# Patient Record
Sex: Male | Born: 1955
Health system: Southern US, Community
[De-identification: ages and names within clinical notes are randomized; demographics above are authoritative.]

## PROBLEM LIST (undated history)

## (undated) DIAGNOSIS — N486 Induration penis plastica: Secondary | ICD-10-CM

---

## 2008-10-31 ENCOUNTER — Encounter: Admission: RE | Admit: 2008-10-31 | Discharge: 2008-10-31 | Payer: Self-pay | Admitting: Family Medicine

## 2010-09-19 ENCOUNTER — Encounter: Payer: Self-pay | Admitting: Family Medicine

## 2018-01-12 ENCOUNTER — Emergency Department (HOSPITAL_COMMUNITY)
Admission: EM | Admit: 2018-01-12 | Discharge: 2018-01-12 | Discharge status: Against Medical Advice | Payer: 59 | Attending: Emergency Medicine | Admitting: Emergency Medicine

## 2018-01-12 ENCOUNTER — Other Ambulatory Visit: Payer: Self-pay

## 2018-01-12 ENCOUNTER — Emergency Department (HOSPITAL_COMMUNITY): Payer: 59

## 2018-01-12 ENCOUNTER — Encounter (HOSPITAL_COMMUNITY): Payer: Self-pay | Admitting: Emergency Medicine

## 2018-01-12 DIAGNOSIS — Y939 Activity, unspecified: Secondary | ICD-10-CM | POA: Insufficient documentation

## 2018-01-12 DIAGNOSIS — S39840A Fracture of corpus cavernosum penis, initial encounter: Secondary | ICD-10-CM

## 2018-01-12 DIAGNOSIS — R300 Dysuria: Secondary | ICD-10-CM | POA: Diagnosis not present

## 2018-01-12 DIAGNOSIS — Y929 Unspecified place or not applicable: Secondary | ICD-10-CM | POA: Insufficient documentation

## 2018-01-12 DIAGNOSIS — Y999 Unspecified external cause status: Secondary | ICD-10-CM | POA: Diagnosis not present

## 2018-01-12 DIAGNOSIS — X58XXXA Exposure to other specified factors, initial encounter: Secondary | ICD-10-CM | POA: Insufficient documentation

## 2018-01-12 DIAGNOSIS — N4889 Other specified disorders of penis: Secondary | ICD-10-CM | POA: Diagnosis present

## 2018-01-12 DIAGNOSIS — Z79899 Other long term (current) drug therapy: Secondary | ICD-10-CM | POA: Insufficient documentation

## 2018-01-12 HISTORY — DX: Induration penis plastica: N48.6

## 2018-01-12 LAB — URINALYSIS, ROUTINE W REFLEX MICROSCOPIC
Bilirubin Urine: NEGATIVE
GLUCOSE, UA: NEGATIVE mg/dL
HGB URINE DIPSTICK: NEGATIVE
Ketones, ur: NEGATIVE mg/dL
LEUKOCYTES UA: NEGATIVE
Nitrite: NEGATIVE
PH: 8 (ref 5.0–8.0)
PROTEIN: NEGATIVE mg/dL
Specific Gravity, Urine: 1.01 (ref 1.005–1.030)

## 2018-01-12 SURGERY — REPAIR, FRACTURE, PENIS
Anesthesia: Choice

## 2018-01-12 MED ORDER — GADOBENATE DIMEGLUMINE 529 MG/ML IV SOLN
15.0000 mL | Freq: Once | INTRAVENOUS | Status: AC | PRN
  Administered 2018-01-12: 15 mL via INTRAVENOUS

## 2018-01-12 NOTE — ED Provider Notes (Addendum)
[facility] Provider Note   CSN: [id number] Arrival date & time: 01/12/18  0239     History   Chief Complaint Chief Complaint  Patient presents with  . Medication Reaction    HPI Troy Bradford is a 62 y.o. male who presents with penis pain. PMH significant for Peyronie disease. He states that he sees Dr. Ronne Binning at [facility] who has been giving him Xiaflex injections. He had his first injection cycle of injections in March without any complications. He had his second round of injections on 5/14 and 5/16 (yesterday). Early in the morning (1AM) he was lying prone and then got up to go to the bathroom. He states he does not think he had an erection. When he urinated he had severe pain and he noticed that his penis was swollen and bruised. He reports being able to urinate but is hesitant to due to the pain. He subsequently came to the ED and states it has become worse since he's been here. He denies abdominal pain, N/V, testicular pain.  HPI  Past Medical History:  Diagnosis Date  . Peyronie disease     There are no active problems to display for this patient.   History reviewed. No pertinent surgical history.      Home Medications    Prior to Admission medications   Medication Sig Start Date End Date Taking? Authorizing Provider  Coenzyme Q10 (CO Q10) 200 MG CAPS Take 200 mg by mouth daily.   Yes [provider]  Collagenase Clostrid Histolyt (XIAFLEX) 0.9 MG SOLR Inject 0.9 mg as directed every 3 (three) days. Ever 3 days then off for 6 weeks   Yes [provider]  Magnesium 500 MG CAPS Take 500 mg by mouth daily.   Yes [provider]  Multiple Vitamin (MULTIVITAMIN WITH MINERALS) TABS tablet Take 1 tablet by mouth daily.   Yes [provider]  Omega-3 Fatty Acids (FISH OIL) 1200 MG CAPS Take 1,200 mg by mouth daily.   Yes [provider]  POTASSIUM PO Take 250 mg by mouth daily.   Yes  [provider]  rosuvastatin (CRESTOR) 10 MG tablet Take 10 mg by mouth daily.   Yes [provider]  tamsulosin (FLOMAX) 0.4 MG CAPS capsule Take 0.4 mg by mouth daily.   Yes [provider]    Family History No family history on file.  Social History Social History   Tobacco Use  . Smoking status: Never Smoker  . Smokeless tobacco: Never Used  Substance Use Topics  . Alcohol use: Never    Frequency: Never  . Drug use: Never     Allergies   Patient has no known allergies.   Review of Systems Review of Systems  Gastrointestinal: Negative for abdominal pain.  Genitourinary: Positive for dysuria, penile pain and penile swelling. Negative for decreased urine volume, difficulty urinating, discharge, flank pain and scrotal swelling.  All other systems reviewed and are negative.    Physical Exam Updated Vital Signs BP 133/86 (BP Location: Right Arm)   Pulse 75   Temp 98.7 F (37.1 C) (Oral)   Resp 16   SpO2 99%   Physical Exam  Constitutional: He is oriented to person, place, and time. He appears well-developed and well-nourished. No distress.  HENT:  Head: Normocephalic and atraumatic.  Eyes: Pupils are equal, round, and reactive to light. Conjunctivae are normal. Right eye exhibits no discharge. Left eye exhibits no discharge. No scleral icterus.  Neck: Normal range of motion.  Cardiovascular: Normal rate.  Pulmonary/Chest: Effort normal. No respiratory distress.  Abdominal: He exhibits no distension.  Genitourinary:  Genitourinary Comments: No inguinal lymphadenopathy or inguinal hernia noted. Circumcised penis. There is significant ecchymosis and swelling of the penis shaft which extends to the base. Testicles are nontender with normal lie. Normal scrotal appearance. No obvious discharge noted. Chaperone present during exam.    Neurological: He is alert and oriented to person, place, and time.  Skin: Skin is warm and dry.    Psychiatric: He has a normal mood and affect. His behavior is normal.  Nursing note and vitals reviewed.    ED Treatments / Results  Labs (all labs ordered are listed, but only abnormal results are displayed) Labs Reviewed  URINALYSIS, ROUTINE W REFLEX MICROSCOPIC    EKG None  Radiology No results found.  Procedures Procedures (including critical care time)  Medications Ordered in ED Medications - No data to display   Initial Impression / Assessment and Plan / ED Course  I have reviewed the triage vital signs and the nursing notes.  Pertinent labs & imaging results that were available during my care of the patient were reviewed by me and considered in my medical decision making (see chart for details).  59:17 AM 62 year old male presents with acute onset of dysuria, penis swelling and ecchymosis. Will consult Urology.  6:30 AM Spoke with Dr. Vernie Ammons who will come to see the patient. Concern for penile fracture.  7:00 AM MRI pelvis ordered to r/u penile fracture. UA is normal.  1:44 PM Pt has an acute penile fracture. Urology re-paged.  3:37 PM Urology to admit  3:54 PM Pt now does not want to be admitted for surgery. Urology discussed risks of leaving. He will leave AMA.     Final Clinical Impressions(s) / ED Diagnoses   Final diagnoses:  Fracture of corpus cavernosum penis, initial encounter    ED Discharge Orders    None       Bethel Born, PA-C 01/12/18 1537    Bethel Born, PA-C 01/12/18 1555    Zadie Rhine, MD 01/12/18 [phone]

## 2018-01-12 NOTE — ED Notes (Signed)
Delay explained to the patient. MRI stated that the patient will most likely go to MRI at 1130. Patient verbalized understanding and this RN thanked patient for their patience.

## 2018-01-12 NOTE — ED Notes (Signed)
MRI states that they will get patient in 30-45 minutes.

## 2018-01-12 NOTE — ED Notes (Signed)
Patient transported to MRI 

## 2018-01-12 NOTE — ED Triage Notes (Addendum)
Pt from home with c/o penile pain when urinating as a medication reaction to xiaflex for his peyronie's disease. Pt states when urinating pain is 10/10. During assessment pain is 0/10. Pt is able to urinate despite pain

## 2018-01-12 NOTE — Consult Note (Signed)
Urology Consult  CC: Referring physician: Bruce Donath, MD  Reason for referral: Acute penile pain and swelling  Impression/Assessment: Penile pain and swelling after Xiaflex injection: He had an acute onset of penile pain with some swelling and some ecchymoses.  Clinically I have a low suspicion for penile fracture although that is a known risk and I believe should be ruled out with further imaging studies. His UA was clear.  I am going to obtain an MRI scan.  2:30 p.m. the patient had been in the ER for several hours awaiting his MRI scan and once it was obtained it revealed the 2 areas of viable at injection.  Initially it was read the radiologist as corporal disruption however after I had spoken with the radiologist about what had been performed and how this procedure could potentially affect the images of his penis and the plaque it was felt that there in fact was not a corporal disruption but these were the 2 locations of the Xiaflex injection.   Plan:  1.  I recommended icing the penis. 2.  He was counseled to follow up in 1 week in our office for reassessment.       History of Present Illness: Troy Bradford is a 62 year old male who is undergoing Xiaflex treatment for Peyronie's disease.  He received his second of 2 injections of his second round of treatment yesterday.  He did fine after the procedure with no significant swelling or pain.  He was having no voiding difficulties.  This morning at 1 AM he awoke.  He was laying in the prone position and when queried as to whether he had an erection he indicated that he did not know.  He did get up to urinate and while urinating he experienced severe pain in the area of the injection site.  He said the pain subsided but then occurred 2 more times over about a 15-minute span and he then noted swelling of the penis.  He said he did notice that when he urinated there was no blood in the urine and no blood dripping from the end of the penis.  He  has not had an erection since this occurred.  He is tender.   2:30 pm The patient reported that over the time period that he had been in the emergency room he had noted a decrease in swelling of the penis.  The discoloration of his penis had progressed.   Past Medical History:  Diagnosis Date  . Peyronie disease    History reviewed. No pertinent surgical history.  Medications: Prior to Admission:  (Not in a hospital admission)  Allergies: No Known Allergies  No family history on file.  Social History:  reports that he has never smoked. He has never used smokeless tobacco. He reports that he does not drink alcohol or use drugs.  Review of Systems (10 point): Pertinent items are noted in HPI. A comprehensive review of systems was negative except as noted above.  Physical Exam:  Vital signs in last 24 hours: Temp:  [98.7 F (37.1 C)] 98.7 F (37.1 C) (05/17 0316) Pulse Rate:  [75] 75 (05/17 0316) Resp:  [16] 16 (05/17 0316) BP: (133)/(86) 133/86 (05/17 0316) SpO2:  [99 %] 99 % (05/17 0316) General appearance: alert and appears stated age Head: Normocephalic, without obvious abnormality, atraumatic Eyes: conjunctivae/corneas clear. EOM's intact.  Oropharynx: moist mucous membranes Neck: supple, symmetrical, trachea midline Resp: normal respiratory effort Cardio: regular rate and rhythm Back: symmetric, no  curvature. ROM normal. No CVA tenderness. GI: soft, non-tender; bowel sounds normal; no masses,  no organomegaly  Male genitalia: He has a normal circumcised phallus with mild edema and mild to moderate ecchymoses.  There is no eggplant deformity.  He is tender in the area where his last Xiaflex injection occurred.  No blood at the meatus.  2:30 pm Reexamination of the patient revealed there had been progression of his ecchymoses with a decrease in the swelling.  Extremities: extremities normal, atraumatic, no cyanosis or edema Skin: Skin color normal. No visible rashes  or lesions Neurologic: Grossly normal  Laboratory Data:  No results for input(s): WBC, HGB, HCT in the last 72 hours. BMET No results for input(s): NA, K, CL, CO2, GLUCOSE, BUN, CREATININE, CALCIUM in the last 72 hours. No results for input(s): LABPT, INR in the last 72 hours. No results for input(s): LABURIN in the last 72 hours. No results found for this or any previous visit. Creatinine: No results for input(s): CREATININE in the last 168 hours.  Imaging: No results found.     Troy Bradford C 01/12/2018, 6:52 AM

## 2018-01-13 LAB — URINE CULTURE: Culture: NO GROWTH

## 2018-01-19 ENCOUNTER — Ambulatory Visit: Admit: 2018-01-19 | Payer: 59 | Admitting: Urology

## 2018-09-03 ENCOUNTER — Ambulatory Visit: Payer: Self-pay | Admitting: Surgery

## 2019-04-29 IMAGING — MR MR PELVIS WO/W CM
9 of 13 series · 22 of 48 positions shown · IV contrast (Y)
Comparison: None.

ADDENDUM:
Additional history provided by Dr. Armel. The patient is status
post recent therapeutic injection/disruption of Peyronie's plaques
in the exact of the pertinent imaging findings. Notably, at least
one injection resulted in significant hematoma. As such, the imaging
findings can all be accounted for by the recent procedure and
underlying Peyronie's plaques, and therefore do not suggest acute
penile fracture.

This was discussed with Dr Armel on 01/12/2018 at 3522 hours.
CLINICAL DATA: Severe penile pain, dysuria, evaluate for penile
fracture
EXAM:
MRI PELVIS WITHOUT AND WITH CONTRAST
TECHNIQUE: Multiplanar multisequence MR imaging of the pelvis was performed
both before and after administration of intravenous contrast.
CONTRAST:  15mL MULTIHANCE GADOBENATE DIMEGLUMINE 529 MG/ML IV SOLN

[Series 3: T1 · axial · 5.0mm · 0.68mm/px · z∈[-124,+200]mm · 4 of 55 slices shown]
[im 1/55]
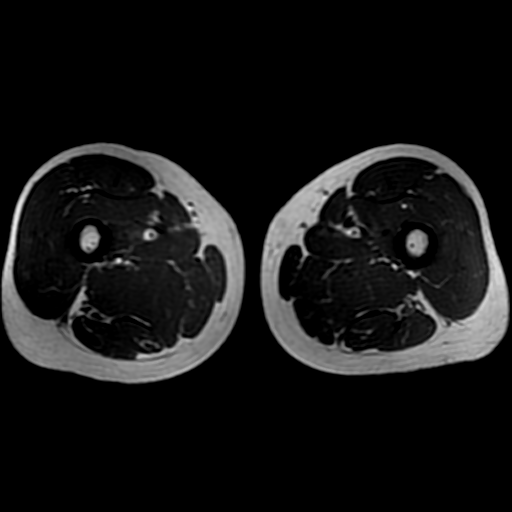
[im 19/55]
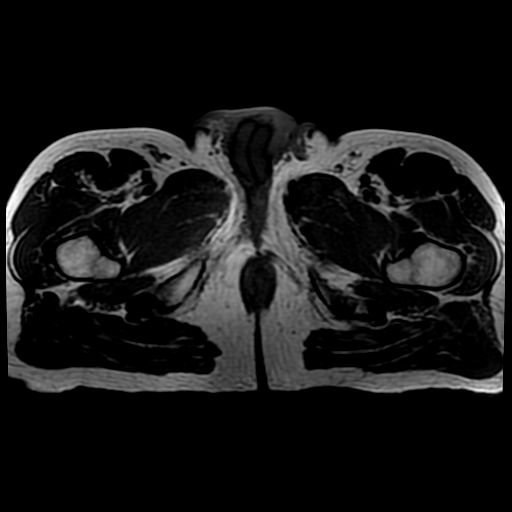
[im 37/55]
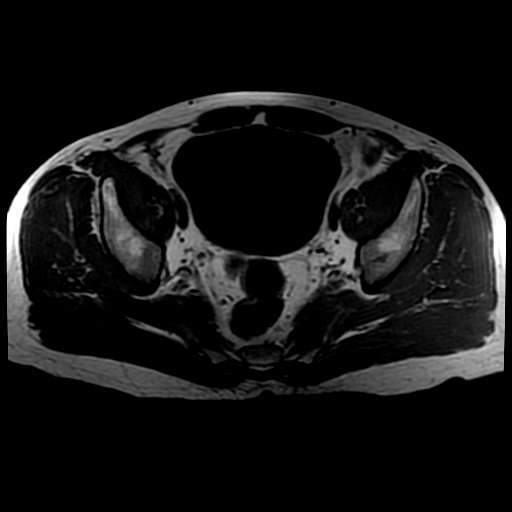
[im 55/55]
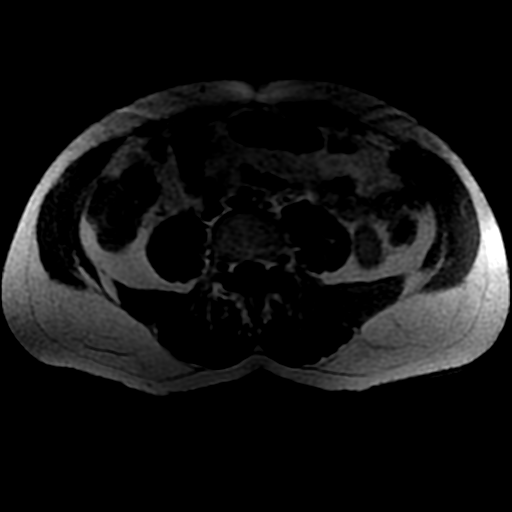

[Series 4: T2 · axial · 5.0mm · 0.68mm/px · z∈[-124,+200]mm · 4 of 55 slices shown (1 of 4)]
[im 1/55]
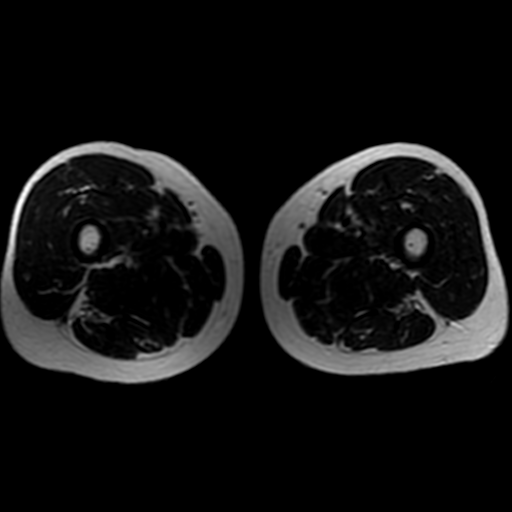
[im 19/55]
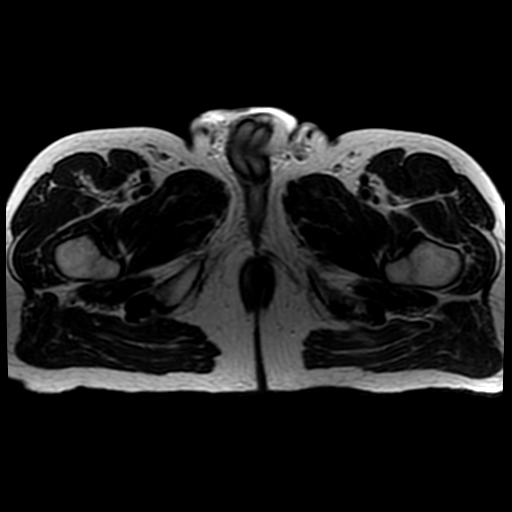
[im 37/55]
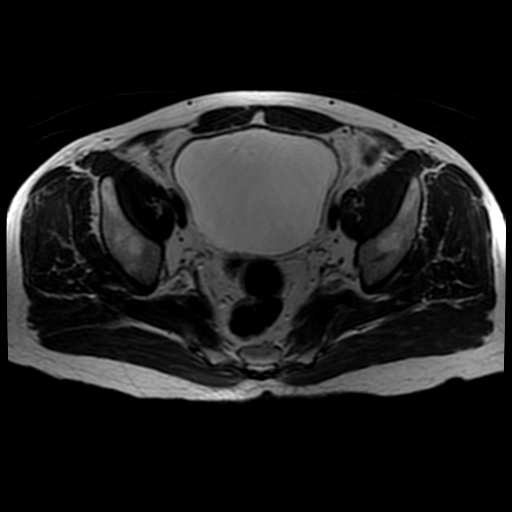
[im 55/55]
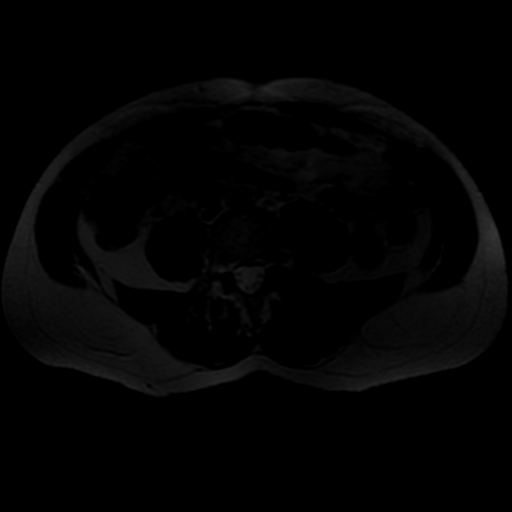

[Series 5: T2 · axial · 4.0mm · 0.35mm/px · z∈[-112,+55]mm · 2 of 38 slices shown (2 of 4)]
[im 1/38]
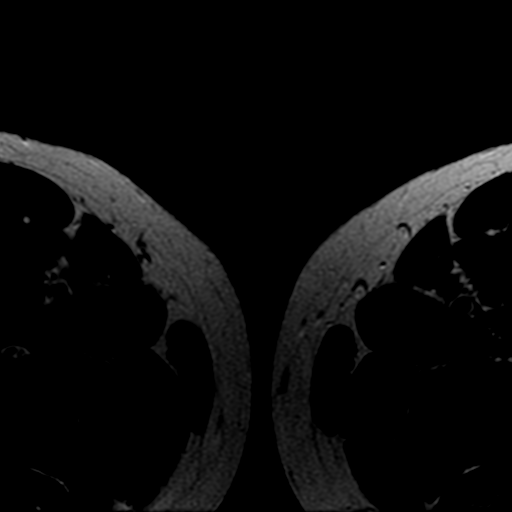
[im 38/38]
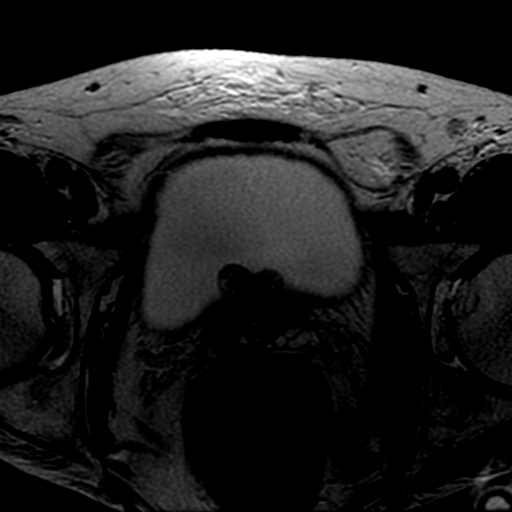

[Series 6: T2 · coronal · 4.0mm · 0.39mm/px · 2 of 32 slices shown (3 of 4)]
[im 1/32]
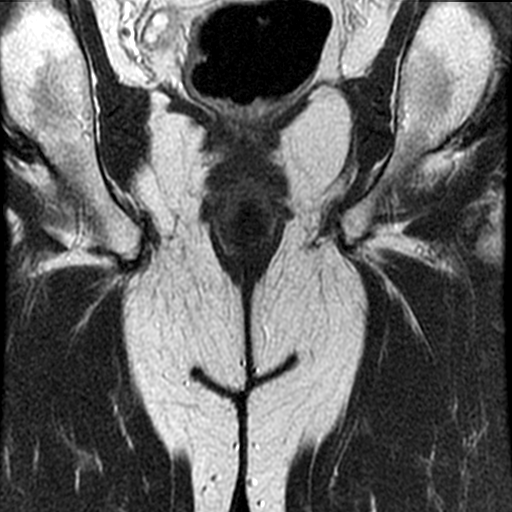
[im 32/32]
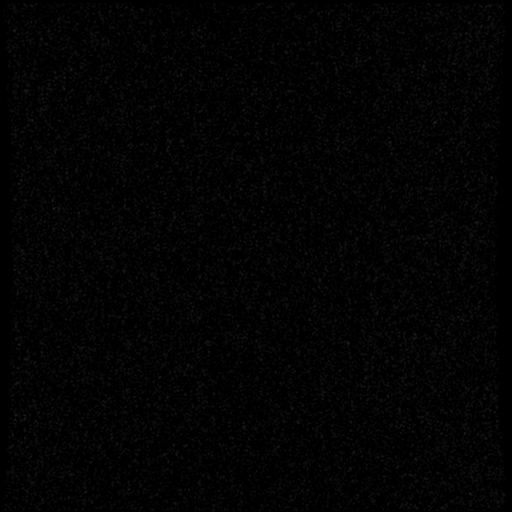

[Series 7: T2 · sagittal · 4.0mm · 0.39mm/px · 1 of 26 slices shown (4 of 4)]
[im 1/26]
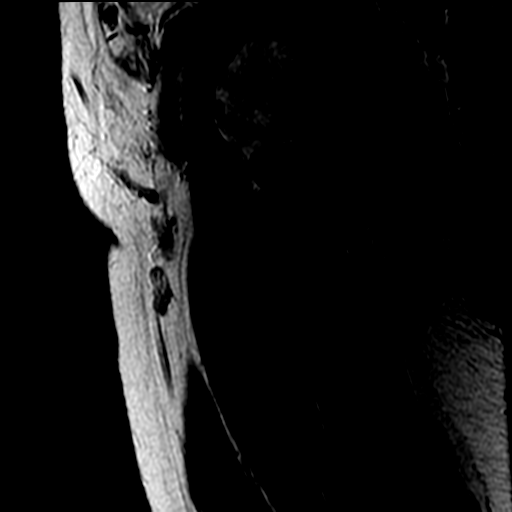

[Series 8: T2 fat-sat · sagittal · 4.0mm · 0.39mm/px · 1 of 26 slices shown (1 of 2)]
[im 1/26]
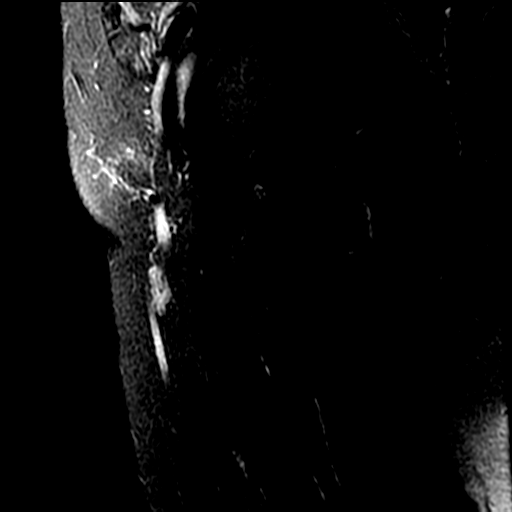

[Series 9: STIR · axial · 8.0mm · 1.33mm/px · z∈[-121,+189]mm · 3 of 64 slices shown]
[im 1/64]
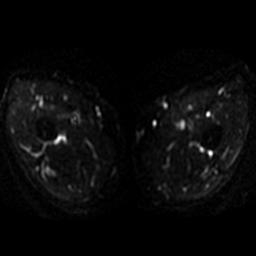
[im 32/64]
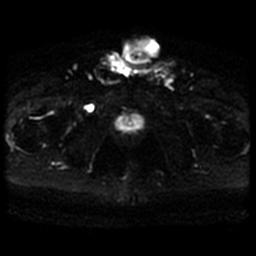
[im 64/64]
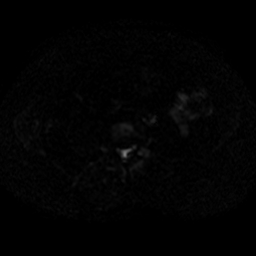

[Series 10: T2 fat-sat · axial · 5.0mm · 0.68mm/px · z∈[-124,+200]mm · 3 of 55 slices shown (2 of 2)]
[im 1/55]
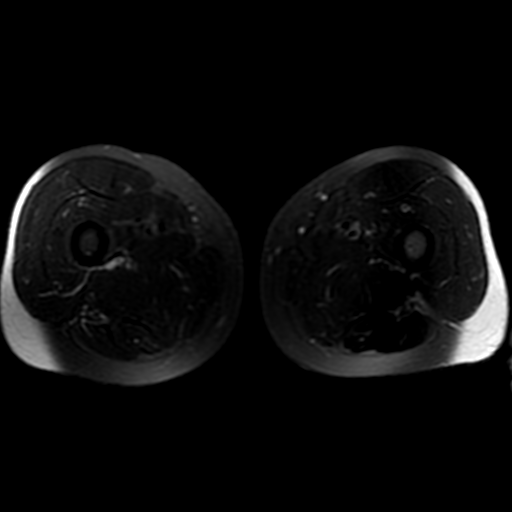
[im 28/55]
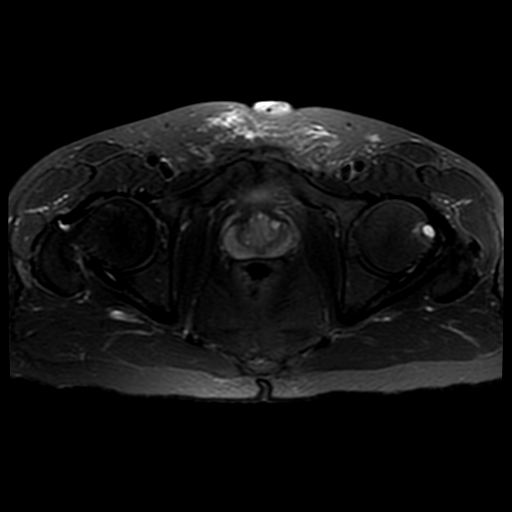
[im 55/55]
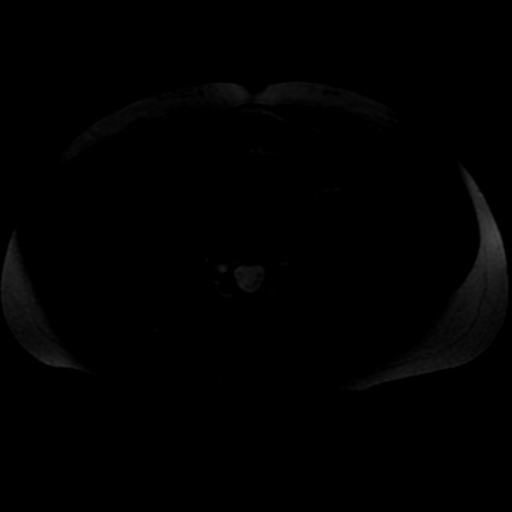

[Series 900: DWI · axial · 8.0mm · 1.33mm/px · z∈[-121,+189]mm · 2 of 32 slices shown]
[im 1/32]
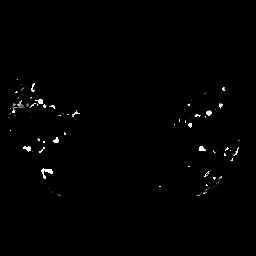
[im 32/32]
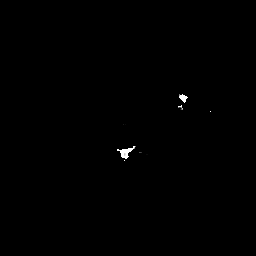

[22 of 48 positions shown; findings below may reference images not displayed]

FINDINGS: Urinary Tract:  Bladder is within normal limits.

Bowel:  Visualized bowel is unremarkable.

Vascular/Lymphatic: No evidence of aneurysm.

No suspicious pelvic lymphadenopathy.

Reproductive:  Prostate is unremarkable.

Two focal defects along the left dorsal aspect of the tunica
albuginea along the distal corpus cavernosum (series 7/images 13).
Associated extra-tunical penile hematoma (series 10/image 35),
confirming acuity. This of appearance is compatible with acute
penile disruption/fracture, possibly on the basis of underlying
Peyronie's plaques.

Other:  No pelvic ascites.

Musculoskeletal: No focal osseous lesions.
IMPRESSION: Acute disruption of the tunica albuginea/penile fracture with
associated hematoma, possibly on the basis of underlying Peyronie's
plaques, as described above.

These results were called by telephone at the time of interpretation
on 01/12/2018 at [DATE] to Saturnino Tong, who verbally acknowledged
these results.

## 2020-08-17 DIAGNOSIS — R300 Dysuria: Secondary | ICD-10-CM | POA: Diagnosis not present

## 2020-11-18 DIAGNOSIS — N400 Enlarged prostate without lower urinary tract symptoms: Secondary | ICD-10-CM | POA: Diagnosis not present

## 2020-11-18 DIAGNOSIS — E7800 Pure hypercholesterolemia, unspecified: Secondary | ICD-10-CM | POA: Diagnosis not present

## 2020-11-18 DIAGNOSIS — Z5181 Encounter for therapeutic drug level monitoring: Secondary | ICD-10-CM | POA: Diagnosis not present

## 2020-11-18 DIAGNOSIS — G2581 Restless legs syndrome: Secondary | ICD-10-CM | POA: Diagnosis not present

## 2020-11-18 DIAGNOSIS — Z125 Encounter for screening for malignant neoplasm of prostate: Secondary | ICD-10-CM | POA: Diagnosis not present

## 2020-11-18 DIAGNOSIS — Z79899 Other long term (current) drug therapy: Secondary | ICD-10-CM | POA: Diagnosis not present

## 2020-11-21 DIAGNOSIS — Z03818 Encounter for observation for suspected exposure to other biological agents ruled out: Secondary | ICD-10-CM | POA: Diagnosis not present

## 2020-11-21 DIAGNOSIS — Z20822 Contact with and (suspected) exposure to covid-19: Secondary | ICD-10-CM | POA: Diagnosis not present

## 2021-11-18 DIAGNOSIS — N400 Enlarged prostate without lower urinary tract symptoms: Secondary | ICD-10-CM | POA: Diagnosis not present

## 2021-11-18 DIAGNOSIS — Z125 Encounter for screening for malignant neoplasm of prostate: Secondary | ICD-10-CM | POA: Diagnosis not present

## 2021-11-18 DIAGNOSIS — E7800 Pure hypercholesterolemia, unspecified: Secondary | ICD-10-CM | POA: Diagnosis not present

## 2021-11-18 DIAGNOSIS — R202 Paresthesia of skin: Secondary | ICD-10-CM | POA: Diagnosis not present

## 2021-11-18 DIAGNOSIS — Z8042 Family history of malignant neoplasm of prostate: Secondary | ICD-10-CM | POA: Diagnosis not present

## 2022-08-03 DIAGNOSIS — D123 Benign neoplasm of transverse colon: Secondary | ICD-10-CM | POA: Diagnosis not present

## 2022-08-03 DIAGNOSIS — Z1211 Encounter for screening for malignant neoplasm of colon: Secondary | ICD-10-CM | POA: Diagnosis not present

## 2022-08-03 DIAGNOSIS — K641 Second degree hemorrhoids: Secondary | ICD-10-CM | POA: Diagnosis not present

## 2022-08-03 DIAGNOSIS — K635 Polyp of colon: Secondary | ICD-10-CM | POA: Diagnosis not present

## 2022-08-03 DIAGNOSIS — K5730 Diverticulosis of large intestine without perforation or abscess without bleeding: Secondary | ICD-10-CM | POA: Diagnosis not present

## 2022-08-03 DIAGNOSIS — K648 Other hemorrhoids: Secondary | ICD-10-CM | POA: Diagnosis not present

## 2022-10-17 DIAGNOSIS — M530 Cervicocranial syndrome: Secondary | ICD-10-CM | POA: Diagnosis not present

## 2022-10-17 DIAGNOSIS — M9901 Segmental and somatic dysfunction of cervical region: Secondary | ICD-10-CM | POA: Diagnosis not present

## 2022-10-17 DIAGNOSIS — M5416 Radiculopathy, lumbar region: Secondary | ICD-10-CM | POA: Diagnosis not present

## 2022-10-17 DIAGNOSIS — M9903 Segmental and somatic dysfunction of lumbar region: Secondary | ICD-10-CM | POA: Diagnosis not present

## 2022-10-19 DIAGNOSIS — M5416 Radiculopathy, lumbar region: Secondary | ICD-10-CM | POA: Diagnosis not present

## 2022-10-19 DIAGNOSIS — M9901 Segmental and somatic dysfunction of cervical region: Secondary | ICD-10-CM | POA: Diagnosis not present

## 2022-10-19 DIAGNOSIS — M9903 Segmental and somatic dysfunction of lumbar region: Secondary | ICD-10-CM | POA: Diagnosis not present

## 2022-10-19 DIAGNOSIS — M530 Cervicocranial syndrome: Secondary | ICD-10-CM | POA: Diagnosis not present

## 2022-10-20 DIAGNOSIS — M9903 Segmental and somatic dysfunction of lumbar region: Secondary | ICD-10-CM | POA: Diagnosis not present

## 2022-10-20 DIAGNOSIS — M530 Cervicocranial syndrome: Secondary | ICD-10-CM | POA: Diagnosis not present

## 2022-10-20 DIAGNOSIS — M5416 Radiculopathy, lumbar region: Secondary | ICD-10-CM | POA: Diagnosis not present

## 2022-10-20 DIAGNOSIS — M9901 Segmental and somatic dysfunction of cervical region: Secondary | ICD-10-CM | POA: Diagnosis not present

## 2022-10-31 DIAGNOSIS — M9901 Segmental and somatic dysfunction of cervical region: Secondary | ICD-10-CM | POA: Diagnosis not present

## 2022-10-31 DIAGNOSIS — M530 Cervicocranial syndrome: Secondary | ICD-10-CM | POA: Diagnosis not present

## 2022-10-31 DIAGNOSIS — M5416 Radiculopathy, lumbar region: Secondary | ICD-10-CM | POA: Diagnosis not present

## 2022-10-31 DIAGNOSIS — M9903 Segmental and somatic dysfunction of lumbar region: Secondary | ICD-10-CM | POA: Diagnosis not present

## 2022-11-01 DIAGNOSIS — G2581 Restless legs syndrome: Secondary | ICD-10-CM | POA: Diagnosis not present

## 2022-11-01 DIAGNOSIS — E7800 Pure hypercholesterolemia, unspecified: Secondary | ICD-10-CM | POA: Diagnosis not present

## 2022-11-01 DIAGNOSIS — Z125 Encounter for screening for malignant neoplasm of prostate: Secondary | ICD-10-CM | POA: Diagnosis not present

## 2022-11-01 DIAGNOSIS — N400 Enlarged prostate without lower urinary tract symptoms: Secondary | ICD-10-CM | POA: Diagnosis not present

## 2022-11-01 DIAGNOSIS — Z79899 Other long term (current) drug therapy: Secondary | ICD-10-CM | POA: Diagnosis not present

## 2022-11-01 DIAGNOSIS — Z23 Encounter for immunization: Secondary | ICD-10-CM | POA: Diagnosis not present

## 2022-11-01 DIAGNOSIS — Z8042 Family history of malignant neoplasm of prostate: Secondary | ICD-10-CM | POA: Diagnosis not present

## 2022-11-02 DIAGNOSIS — M9903 Segmental and somatic dysfunction of lumbar region: Secondary | ICD-10-CM | POA: Diagnosis not present

## 2022-11-02 DIAGNOSIS — M5416 Radiculopathy, lumbar region: Secondary | ICD-10-CM | POA: Diagnosis not present

## 2022-11-02 DIAGNOSIS — M530 Cervicocranial syndrome: Secondary | ICD-10-CM | POA: Diagnosis not present

## 2022-11-02 DIAGNOSIS — M9901 Segmental and somatic dysfunction of cervical region: Secondary | ICD-10-CM | POA: Diagnosis not present

## 2022-11-03 DIAGNOSIS — M5416 Radiculopathy, lumbar region: Secondary | ICD-10-CM | POA: Diagnosis not present

## 2022-11-03 DIAGNOSIS — M9901 Segmental and somatic dysfunction of cervical region: Secondary | ICD-10-CM | POA: Diagnosis not present

## 2022-11-03 DIAGNOSIS — M9903 Segmental and somatic dysfunction of lumbar region: Secondary | ICD-10-CM | POA: Diagnosis not present

## 2022-11-03 DIAGNOSIS — M530 Cervicocranial syndrome: Secondary | ICD-10-CM | POA: Diagnosis not present

## 2022-11-21 DIAGNOSIS — M9901 Segmental and somatic dysfunction of cervical region: Secondary | ICD-10-CM | POA: Diagnosis not present

## 2022-11-21 DIAGNOSIS — M5416 Radiculopathy, lumbar region: Secondary | ICD-10-CM | POA: Diagnosis not present

## 2022-11-21 DIAGNOSIS — M530 Cervicocranial syndrome: Secondary | ICD-10-CM | POA: Diagnosis not present

## 2022-11-21 DIAGNOSIS — M9903 Segmental and somatic dysfunction of lumbar region: Secondary | ICD-10-CM | POA: Diagnosis not present

## 2022-11-23 DIAGNOSIS — M9901 Segmental and somatic dysfunction of cervical region: Secondary | ICD-10-CM | POA: Diagnosis not present

## 2022-11-23 DIAGNOSIS — M530 Cervicocranial syndrome: Secondary | ICD-10-CM | POA: Diagnosis not present

## 2022-11-23 DIAGNOSIS — M9903 Segmental and somatic dysfunction of lumbar region: Secondary | ICD-10-CM | POA: Diagnosis not present

## 2022-11-23 DIAGNOSIS — M5416 Radiculopathy, lumbar region: Secondary | ICD-10-CM | POA: Diagnosis not present

## 2022-11-24 DIAGNOSIS — M5416 Radiculopathy, lumbar region: Secondary | ICD-10-CM | POA: Diagnosis not present

## 2022-11-24 DIAGNOSIS — M9901 Segmental and somatic dysfunction of cervical region: Secondary | ICD-10-CM | POA: Diagnosis not present

## 2022-11-24 DIAGNOSIS — M530 Cervicocranial syndrome: Secondary | ICD-10-CM | POA: Diagnosis not present

## 2022-11-24 DIAGNOSIS — M9903 Segmental and somatic dysfunction of lumbar region: Secondary | ICD-10-CM | POA: Diagnosis not present

## 2022-12-05 DIAGNOSIS — M5416 Radiculopathy, lumbar region: Secondary | ICD-10-CM | POA: Diagnosis not present

## 2022-12-05 DIAGNOSIS — M9901 Segmental and somatic dysfunction of cervical region: Secondary | ICD-10-CM | POA: Diagnosis not present

## 2022-12-05 DIAGNOSIS — M9903 Segmental and somatic dysfunction of lumbar region: Secondary | ICD-10-CM | POA: Diagnosis not present

## 2022-12-05 DIAGNOSIS — M530 Cervicocranial syndrome: Secondary | ICD-10-CM | POA: Diagnosis not present

## 2022-12-07 DIAGNOSIS — M9903 Segmental and somatic dysfunction of lumbar region: Secondary | ICD-10-CM | POA: Diagnosis not present

## 2022-12-07 DIAGNOSIS — M5416 Radiculopathy, lumbar region: Secondary | ICD-10-CM | POA: Diagnosis not present

## 2022-12-07 DIAGNOSIS — M9901 Segmental and somatic dysfunction of cervical region: Secondary | ICD-10-CM | POA: Diagnosis not present

## 2022-12-07 DIAGNOSIS — M530 Cervicocranial syndrome: Secondary | ICD-10-CM | POA: Diagnosis not present

## 2022-12-08 DIAGNOSIS — M9901 Segmental and somatic dysfunction of cervical region: Secondary | ICD-10-CM | POA: Diagnosis not present

## 2022-12-08 DIAGNOSIS — M530 Cervicocranial syndrome: Secondary | ICD-10-CM | POA: Diagnosis not present

## 2022-12-08 DIAGNOSIS — M9903 Segmental and somatic dysfunction of lumbar region: Secondary | ICD-10-CM | POA: Diagnosis not present

## 2022-12-08 DIAGNOSIS — M5416 Radiculopathy, lumbar region: Secondary | ICD-10-CM | POA: Diagnosis not present

## 2022-12-12 DIAGNOSIS — M5416 Radiculopathy, lumbar region: Secondary | ICD-10-CM | POA: Diagnosis not present

## 2022-12-12 DIAGNOSIS — M530 Cervicocranial syndrome: Secondary | ICD-10-CM | POA: Diagnosis not present

## 2022-12-12 DIAGNOSIS — M9901 Segmental and somatic dysfunction of cervical region: Secondary | ICD-10-CM | POA: Diagnosis not present

## 2022-12-12 DIAGNOSIS — M9903 Segmental and somatic dysfunction of lumbar region: Secondary | ICD-10-CM | POA: Diagnosis not present

## 2022-12-26 DIAGNOSIS — M5416 Radiculopathy, lumbar region: Secondary | ICD-10-CM | POA: Diagnosis not present

## 2022-12-26 DIAGNOSIS — M9901 Segmental and somatic dysfunction of cervical region: Secondary | ICD-10-CM | POA: Diagnosis not present

## 2022-12-26 DIAGNOSIS — M9903 Segmental and somatic dysfunction of lumbar region: Secondary | ICD-10-CM | POA: Diagnosis not present

## 2022-12-26 DIAGNOSIS — M530 Cervicocranial syndrome: Secondary | ICD-10-CM | POA: Diagnosis not present

## 2022-12-29 DIAGNOSIS — M9903 Segmental and somatic dysfunction of lumbar region: Secondary | ICD-10-CM | POA: Diagnosis not present

## 2022-12-29 DIAGNOSIS — M9901 Segmental and somatic dysfunction of cervical region: Secondary | ICD-10-CM | POA: Diagnosis not present

## 2022-12-29 DIAGNOSIS — M5416 Radiculopathy, lumbar region: Secondary | ICD-10-CM | POA: Diagnosis not present

## 2022-12-29 DIAGNOSIS — M530 Cervicocranial syndrome: Secondary | ICD-10-CM | POA: Diagnosis not present

## 2023-01-02 DIAGNOSIS — M530 Cervicocranial syndrome: Secondary | ICD-10-CM | POA: Diagnosis not present

## 2023-01-02 DIAGNOSIS — M9903 Segmental and somatic dysfunction of lumbar region: Secondary | ICD-10-CM | POA: Diagnosis not present

## 2023-01-02 DIAGNOSIS — M9901 Segmental and somatic dysfunction of cervical region: Secondary | ICD-10-CM | POA: Diagnosis not present

## 2023-01-02 DIAGNOSIS — M5416 Radiculopathy, lumbar region: Secondary | ICD-10-CM | POA: Diagnosis not present

## 2023-01-09 DIAGNOSIS — M9903 Segmental and somatic dysfunction of lumbar region: Secondary | ICD-10-CM | POA: Diagnosis not present

## 2023-01-09 DIAGNOSIS — M530 Cervicocranial syndrome: Secondary | ICD-10-CM | POA: Diagnosis not present

## 2023-01-09 DIAGNOSIS — M9901 Segmental and somatic dysfunction of cervical region: Secondary | ICD-10-CM | POA: Diagnosis not present

## 2023-01-09 DIAGNOSIS — M5416 Radiculopathy, lumbar region: Secondary | ICD-10-CM | POA: Diagnosis not present

## 2023-01-16 DIAGNOSIS — M530 Cervicocranial syndrome: Secondary | ICD-10-CM | POA: Diagnosis not present

## 2023-01-16 DIAGNOSIS — M5416 Radiculopathy, lumbar region: Secondary | ICD-10-CM | POA: Diagnosis not present

## 2023-01-16 DIAGNOSIS — M9903 Segmental and somatic dysfunction of lumbar region: Secondary | ICD-10-CM | POA: Diagnosis not present

## 2023-01-16 DIAGNOSIS — M9901 Segmental and somatic dysfunction of cervical region: Secondary | ICD-10-CM | POA: Diagnosis not present

## 2023-01-25 DIAGNOSIS — M9901 Segmental and somatic dysfunction of cervical region: Secondary | ICD-10-CM | POA: Diagnosis not present

## 2023-01-25 DIAGNOSIS — M530 Cervicocranial syndrome: Secondary | ICD-10-CM | POA: Diagnosis not present

## 2023-01-25 DIAGNOSIS — M9903 Segmental and somatic dysfunction of lumbar region: Secondary | ICD-10-CM | POA: Diagnosis not present

## 2023-01-25 DIAGNOSIS — M5416 Radiculopathy, lumbar region: Secondary | ICD-10-CM | POA: Diagnosis not present

## 2023-02-02 DIAGNOSIS — M9903 Segmental and somatic dysfunction of lumbar region: Secondary | ICD-10-CM | POA: Diagnosis not present

## 2023-02-02 DIAGNOSIS — M9901 Segmental and somatic dysfunction of cervical region: Secondary | ICD-10-CM | POA: Diagnosis not present

## 2023-02-02 DIAGNOSIS — M5416 Radiculopathy, lumbar region: Secondary | ICD-10-CM | POA: Diagnosis not present

## 2023-02-02 DIAGNOSIS — M530 Cervicocranial syndrome: Secondary | ICD-10-CM | POA: Diagnosis not present

## 2023-02-09 DIAGNOSIS — M530 Cervicocranial syndrome: Secondary | ICD-10-CM | POA: Diagnosis not present

## 2023-02-09 DIAGNOSIS — M9903 Segmental and somatic dysfunction of lumbar region: Secondary | ICD-10-CM | POA: Diagnosis not present

## 2023-02-09 DIAGNOSIS — M9901 Segmental and somatic dysfunction of cervical region: Secondary | ICD-10-CM | POA: Diagnosis not present

## 2023-02-09 DIAGNOSIS — M5416 Radiculopathy, lumbar region: Secondary | ICD-10-CM | POA: Diagnosis not present

## 2023-02-16 DIAGNOSIS — M9901 Segmental and somatic dysfunction of cervical region: Secondary | ICD-10-CM | POA: Diagnosis not present

## 2023-02-16 DIAGNOSIS — M530 Cervicocranial syndrome: Secondary | ICD-10-CM | POA: Diagnosis not present

## 2023-02-16 DIAGNOSIS — M5416 Radiculopathy, lumbar region: Secondary | ICD-10-CM | POA: Diagnosis not present

## 2023-02-16 DIAGNOSIS — M9903 Segmental and somatic dysfunction of lumbar region: Secondary | ICD-10-CM | POA: Diagnosis not present

## 2023-02-23 DIAGNOSIS — M9903 Segmental and somatic dysfunction of lumbar region: Secondary | ICD-10-CM | POA: Diagnosis not present

## 2023-02-23 DIAGNOSIS — M9901 Segmental and somatic dysfunction of cervical region: Secondary | ICD-10-CM | POA: Diagnosis not present

## 2023-02-23 DIAGNOSIS — M5416 Radiculopathy, lumbar region: Secondary | ICD-10-CM | POA: Diagnosis not present

## 2023-02-23 DIAGNOSIS — M530 Cervicocranial syndrome: Secondary | ICD-10-CM | POA: Diagnosis not present
